# Patient Record
Sex: Female | Born: 1955 | Race: White | Hispanic: No | Marital: Married
Health system: Southern US, Community
[De-identification: ages and names within clinical notes are randomized; demographics above are authoritative.]

---

## 2004-09-17 ENCOUNTER — Emergency Department: Payer: Self-pay | Admitting: Unknown Physician Specialty

## 2010-10-26 ENCOUNTER — Emergency Department: Payer: Self-pay | Admitting: Unknown Physician Specialty

## 2011-06-12 ENCOUNTER — Emergency Department: Payer: Self-pay | Admitting: Emergency Medicine

## 2011-06-12 LAB — BASIC METABOLIC PANEL
BUN: 16 mg/dL (ref 7–18)
Calcium, Total: 9.1 mg/dL (ref 8.5–10.1)
Chloride: 110 mmol/L — ABNORMAL HIGH (ref 98–107)
Creatinine: 0.67 mg/dL (ref 0.60–1.30)
EGFR (African American): >60
Glucose: 94 mg/dL (ref 65–99)
Osmolality: 284 (ref 275–301)
Potassium: 3.7 mmol/L (ref 3.5–5.1)
Sodium: 142 mmol/L (ref 136–145)

## 2011-06-12 LAB — CBC
HGB: 13.9 g/dL (ref 12.0–16.0)
MCH: 31.2 pg (ref 26.0–34.0)
MCV: 91 fL (ref 80–100)
Platelet: 199 10*3/uL (ref 150–440)
RBC: 4.44 10*6/uL (ref 3.80–5.20)
RDW: 13.3 % (ref 11.5–14.5)
WBC: 8 10*3/uL (ref 3.6–11.0)

## 2011-06-12 LAB — CK TOTAL AND CKMB (NOT AT ARMC): CK-MB: 1.7 ng/mL (ref 0.5–3.6)

## 2011-06-20 ENCOUNTER — Emergency Department: Payer: Self-pay | Admitting: Internal Medicine

## 2011-06-20 LAB — BASIC METABOLIC PANEL
BUN: 11 mg/dL (ref 7–18)
Chloride: 110 mmol/L — ABNORMAL HIGH (ref 98–107)
Creatinine: 0.79 mg/dL (ref 0.60–1.30)
EGFR (African American): >60
Osmolality: 286 (ref 275–301)
Potassium: 3.5 mmol/L (ref 3.5–5.1)
Sodium: 143 mmol/L (ref 136–145)

## 2011-06-20 LAB — CBC
HCT: 40.6 % (ref 35.0–47.0)
HGB: 13.7 g/dL (ref 12.0–16.0)
MCH: 30.9 pg (ref 26.0–34.0)
MCV: 92 fL (ref 80–100)
Platelet: 217 10*3/uL (ref 150–440)
RDW: 13.4 % (ref 11.5–14.5)
WBC: 10.7 10*3/uL (ref 3.6–11.0)

## 2011-06-20 LAB — CK TOTAL AND CKMB (NOT AT ARMC)
CK, Total: 524 U/L — ABNORMAL HIGH (ref 21–215)
CK-MB: 4.5 ng/mL — ABNORMAL HIGH (ref 0.5–3.6)

## 2012-02-23 ENCOUNTER — Emergency Department: Payer: Self-pay | Admitting: Emergency Medicine

## 2012-02-23 LAB — CBC
HCT: 43.6 % (ref 35.0–47.0)
HGB: 15.3 g/dL (ref 12.0–16.0)
HGB: 15 g/dL (ref 12.0–16.0)
MCH: 32.7 pg (ref 26.0–34.0)
MCH: 31.9 pg (ref 26.0–34.0)
MCHC: 35.2 g/dL (ref 32.0–36.0)
MCHC: 34.4 g/dL (ref 32.0–36.0)
MCV: 93 fL (ref 80–100)
Platelet: 227 10*3/uL (ref 150–440)
RBC: 4.7 10*6/uL (ref 3.80–5.20)
WBC: 8.3 10*3/uL (ref 3.6–11.0)
WBC: 8 10*3/uL (ref 3.6–11.0)

## 2012-02-23 LAB — COMPREHENSIVE METABOLIC PANEL
BUN: 14 mg/dL (ref 7–18)
Bilirubin,Total: 0.4 mg/dL (ref 0.2–1.0)
EGFR (African American): >60
SGPT (ALT): 26 U/L (ref 12–78)
Total Protein: 7.5 g/dL (ref 6.4–8.2)

## 2012-02-23 LAB — TROPONIN I: Troponin-I: <0.02 ng/mL

## 2012-02-23 LAB — BASIC METABOLIC PANEL
BUN: 12 mg/dL (ref 7–18)
Co2: 18 mmol/L — ABNORMAL LOW (ref 21–32)
Creatinine: 0.9 mg/dL (ref 0.60–1.30)
EGFR (Non-African Amer.): >60

## 2012-05-11 ENCOUNTER — Emergency Department: Payer: Self-pay | Admitting: Emergency Medicine

## 2012-05-11 LAB — URINALYSIS, COMPLETE
Bilirubin,UR: NEGATIVE
Blood: NEGATIVE
Glucose,UR: NEGATIVE mg/dL (ref 0–75)
Ketone: NEGATIVE
Ph: 6 (ref 4.5–8.0)
Protein: NEGATIVE
RBC,UR: 3 /HPF (ref 0–5)
Specific Gravity: 1.015 (ref 1.003–1.030)
Squamous Epithelial: 18
WBC UR: 15 /HPF (ref 0–5)

## 2012-05-11 LAB — CBC
HCT: 42.7 % (ref 35.0–47.0)
HGB: 14.5 g/dL (ref 12.0–16.0)
MCH: 30.9 pg (ref 26.0–34.0)
MCHC: 33.8 g/dL (ref 32.0–36.0)
MCV: 91 fL (ref 80–100)
RBC: 4.69 10*6/uL (ref 3.80–5.20)
RDW: 13.4 % (ref 11.5–14.5)
WBC: 8.7 10*3/uL (ref 3.6–11.0)

## 2012-05-11 LAB — BASIC METABOLIC PANEL
Anion Gap: 5 — ABNORMAL LOW (ref 7–16)
BUN: 6 mg/dL — ABNORMAL LOW (ref 7–18)
Co2: 26 mmol/L (ref 21–32)
EGFR (African American): >60
EGFR (Non-African Amer.): >60

## 2012-06-07 ENCOUNTER — Emergency Department: Payer: Self-pay | Admitting: Internal Medicine

## 2012-06-07 LAB — URINALYSIS, COMPLETE
Bacteria: NONE SEEN
Bilirubin,UR: NEGATIVE
Blood: NEGATIVE
Glucose,UR: NEGATIVE mg/dL (ref 0–75)
Ketone: NEGATIVE
Leukocyte Esterase: NEGATIVE
Ph: 5 (ref 4.5–8.0)
Protein: NEGATIVE
RBC,UR: <1 /HPF (ref 0–5)
Squamous Epithelial: 3
WBC UR: 1 /HPF (ref 0–5)

## 2012-06-13 ENCOUNTER — Emergency Department: Payer: Self-pay | Admitting: Emergency Medicine

## 2012-06-15 ENCOUNTER — Emergency Department: Payer: Self-pay | Admitting: Emergency Medicine

## 2012-06-18 ENCOUNTER — Emergency Department: Payer: Self-pay | Admitting: Emergency Medicine

## 2012-06-18 LAB — CBC
HCT: 41.2 % (ref 35.0–47.0)
MCH: 31 pg (ref 26.0–34.0)
MCHC: 34.4 g/dL (ref 32.0–36.0)
MCV: 90 fL (ref 80–100)
Platelet: 215 10*3/uL (ref 150–440)
RBC: 4.57 10*6/uL (ref 3.80–5.20)

## 2012-06-18 LAB — BASIC METABOLIC PANEL
Anion Gap: 4 — ABNORMAL LOW (ref 7–16)
BUN: 13 mg/dL (ref 7–18)
Calcium, Total: 8.7 mg/dL (ref 8.5–10.1)
Co2: 27 mmol/L (ref 21–32)
Creatinine: 0.61 mg/dL (ref 0.60–1.30)
EGFR (African American): >60
EGFR (Non-African Amer.): >60
Glucose: 101 mg/dL — ABNORMAL HIGH (ref 65–99)
Sodium: 142 mmol/L (ref 136–145)

## 2012-06-18 LAB — PRO B NATRIURETIC PEPTIDE: B-Type Natriuretic Peptide: 141 pg/mL — ABNORMAL HIGH (ref 0–125)

## 2012-06-18 LAB — TROPONIN I: Troponin-I: <0.02 ng/mL

## 2012-06-26 ENCOUNTER — Emergency Department: Payer: Self-pay | Admitting: Emergency Medicine

## 2012-06-26 LAB — CBC
HCT: 42.7 % (ref 35.0–47.0)
HGB: 14.6 g/dL (ref 12.0–16.0)
MCH: 31 pg (ref 26.0–34.0)
MCHC: 34.2 g/dL (ref 32.0–36.0)
MCV: 91 fL (ref 80–100)
Platelet: 245 10*3/uL (ref 150–440)

## 2012-06-26 LAB — BASIC METABOLIC PANEL
Anion Gap: 6 — ABNORMAL LOW (ref 7–16)
BUN: 9 mg/dL (ref 7–18)
Calcium, Total: 9.1 mg/dL (ref 8.5–10.1)
EGFR (African American): >60
Osmolality: 273 (ref 275–301)

## 2012-07-09 ENCOUNTER — Inpatient Hospital Stay: Payer: Self-pay | Admitting: Internal Medicine

## 2012-07-09 LAB — CBC
HCT: 43.7 % (ref 35.0–47.0)
HGB: 14.6 g/dL (ref 12.0–16.0)
MCV: 92 fL (ref 80–100)
Platelet: 221 10*3/uL (ref 150–440)
RBC: 4.76 10*6/uL (ref 3.80–5.20)
RDW: 13.3 % (ref 11.5–14.5)

## 2012-07-09 LAB — BASIC METABOLIC PANEL
Anion Gap: 8 (ref 7–16)
BUN: 12 mg/dL (ref 7–18)
Calcium, Total: 9 mg/dL (ref 8.5–10.1)
Chloride: 111 mmol/L — ABNORMAL HIGH (ref 98–107)
Co2: 23 mmol/L (ref 21–32)
Creatinine: 0.51 mg/dL — ABNORMAL LOW (ref 0.60–1.30)
EGFR (African American): >60
EGFR (Non-African Amer.): >60
Glucose: 103 mg/dL — ABNORMAL HIGH (ref 65–99)
Osmolality: 283 (ref 275–301)

## 2012-07-09 LAB — CK TOTAL AND CKMB (NOT AT ARMC): CK-MB: 0.6 ng/mL (ref 0.5–3.6)

## 2012-07-09 LAB — TROPONIN I: Troponin-I: <0.02 ng/mL

## 2012-07-10 LAB — CBC WITH DIFFERENTIAL/PLATELET
Basophil #: 0 10*3/uL (ref 0.0–0.1)
Eosinophil #: 0.1 10*3/uL (ref 0.0–0.7)
HCT: 41.4 % (ref 35.0–47.0)
HGB: 14.2 g/dL (ref 12.0–16.0)
Lymphocyte #: 0.5 10*3/uL — ABNORMAL LOW (ref 1.0–3.6)
Lymphocyte %: 5 %
MCH: 31 pg (ref 26.0–34.0)
MCHC: 34.4 g/dL (ref 32.0–36.0)
MCV: 90 fL (ref 80–100)
Monocyte #: 0.1 x10 3/mm — ABNORMAL LOW (ref 0.2–0.9)
Monocyte %: 1.4 %
Neutrophil %: 92.3 %
Platelet: 203 10*3/uL (ref 150–440)
RBC: 4.59 10*6/uL (ref 3.80–5.20)
RDW: 13.4 % (ref 11.5–14.5)
WBC: 9.5 10*3/uL (ref 3.6–11.0)

## 2012-07-10 LAB — BASIC METABOLIC PANEL
Chloride: 109 mmol/L — ABNORMAL HIGH (ref 98–107)
Co2: 26 mmol/L (ref 21–32)
Creatinine: 0.67 mg/dL (ref 0.60–1.30)
Osmolality: 283 (ref 275–301)
Sodium: 141 mmol/L (ref 136–145)

## 2012-08-22 ENCOUNTER — Emergency Department: Payer: Self-pay | Admitting: Emergency Medicine

## 2012-08-22 LAB — COMPREHENSIVE METABOLIC PANEL
Albumin: 3.4 g/dL (ref 3.4–5.0)
Alkaline Phosphatase: 73 U/L (ref 50–136)
Anion Gap: 9 (ref 7–16)
Bilirubin,Total: 0.5 mg/dL (ref 0.2–1.0)
Calcium, Total: 9.5 mg/dL (ref 8.5–10.1)
Chloride: 106 mmol/L (ref 98–107)
Co2: 25 mmol/L (ref 21–32)
Creatinine: 0.77 mg/dL (ref 0.60–1.30)
EGFR (African American): >60
EGFR (Non-African Amer.): >60
Glucose: 92 mg/dL (ref 65–99)
Osmolality: 277 (ref 275–301)
Potassium: 3.5 mmol/L (ref 3.5–5.1)
SGPT (ALT): 28 U/L (ref 12–78)
Total Protein: 7.5 g/dL (ref 6.4–8.2)

## 2012-08-22 LAB — CBC
HCT: 42.3 % (ref 35.0–47.0)
MCHC: 34.2 g/dL (ref 32.0–36.0)
MCV: 90 fL (ref 80–100)
RDW: 13.5 % (ref 11.5–14.5)
WBC: 9.5 10*3/uL (ref 3.6–11.0)

## 2012-08-22 LAB — CK TOTAL AND CKMB (NOT AT ARMC): CK, Total: 62 U/L (ref 21–215)

## 2012-08-25 ENCOUNTER — Emergency Department: Payer: Self-pay | Admitting: Emergency Medicine

## 2012-08-25 LAB — CBC WITH DIFFERENTIAL/PLATELET
Basophil #: 0.2 10*3/uL — ABNORMAL HIGH (ref 0.0–0.1)
Basophil %: 0.9 %
HGB: 14.7 g/dL (ref 12.0–16.0)
Lymphocyte #: 7.1 10*3/uL — ABNORMAL HIGH (ref 1.0–3.6)
Lymphocyte %: 40.3 %
MCHC: 32 g/dL (ref 32.0–36.0)
MCV: 95 fL (ref 80–100)
Monocyte %: 7.2 %
Neutrophil #: 7 10*3/uL — ABNORMAL HIGH (ref 1.4–6.5)
RDW: 13.7 % (ref 11.5–14.5)
WBC: 17.7 10*3/uL — ABNORMAL HIGH (ref 3.6–11.0)

## 2012-08-25 LAB — COMPREHENSIVE METABOLIC PANEL
Albumin: 3.3 g/dL — ABNORMAL LOW (ref 3.4–5.0)
Alkaline Phosphatase: 87 U/L (ref 50–136)
Anion Gap: 6 — ABNORMAL LOW (ref 7–16)
Bilirubin,Total: 0.3 mg/dL (ref 0.2–1.0)
Calcium, Total: 9 mg/dL (ref 8.5–10.1)
Chloride: 104 mmol/L (ref 98–107)
Creatinine: 1.09 mg/dL (ref 0.60–1.30)
EGFR (African American): >60
Glucose: 197 mg/dL — ABNORMAL HIGH (ref 65–99)
Osmolality: 277 (ref 275–301)
SGOT(AST): 45 U/L — ABNORMAL HIGH (ref 15–37)
Sodium: 137 mmol/L (ref 136–145)

## 2012-09-08 DEATH — deceased

## 2014-05-31 NOTE — H&P (Signed)
PATIENT NAME:  Crystal Mccall, Crystal Mccall MR#:  [id number] DATE OF BIRTH:  [birth year]  DATE OF ADMISSION:  07/09/2012  REFERRING PHYSICIAN: Dr. Mindi Junker   FAMILY PHYSICIAN: None.   REASON FOR ADMISSION:  Acute respiratory failure.   HISTORY OF PRESENT ILLNESS: The patient is a 59 year old female with a history of COPD and asthma, who quit smoking 2 weeks ago. Was in the Emergency Room recently for COPD exacerbation, and was sent out on Advair with albuterol SVNs. Re-presents now with worsening shortness of breath and cough. In the Emergency Room, the patient was profoundly tachypneic and hypoxic, requiring BiPAP. Her blood gas was marginal on BiPAP. She is now admitted to the Intensive Care Unit for further treatment and evaluation.   PAST MEDICAL HISTORY: 1.  COPD/tobacco abuse.  2.  History of asthma.  3.  Migraine headaches.  4.  Depression. 5.  Previous right shoulder injury.  6.  Status post cholecystectomy.  7.  Status post hysterectomy.   MEDICATIONS: 1.  Albuterol 2.5 mg via SVN q. 4 hours p.r.n. shortness of breath.  2.  ProAir 2 puffs q. 4 hours p.r.n. shortness of breath.  3.  Prednisone taper as directed.  4.  Advair 100/50, 1 puff b.i.d.   ALLERGIES:  No known drug allergies.   SOCIAL HISTORY:  The patient quit smoking 2 weeks ago. Denies alcohol abuse.   FAMILY HISTORY:  Positive for hypertension, but otherwise unremarkable.    REVIEW OF SYSTEMS: CONSTITUTIONAL:  No fever or change in weight.  EYES:  No blurred or double vision. No glaucoma.  EARS, NOSE, THROAT: No tinnitus or hearing loss. No nasal discharge or bleeding. No difficulty swallowing.  RESPIRATORY:  No hemoptysis. No painful respiration.  CARDIOVASCULAR:  No chest pain or orthopnea. No palpitations or syncope.  GASTROINTESTINAL: No nausea, vomiting or diarrhea. No abdominal pain. No change in bowel habits.  GENITOURINARY:  No dysuria or hematuria. No incontinence.  ENDOCRINE:  No polyuria or polydipsia. No  heat or cold intolerance.  HEMATOLOGIC:  The patient denies anemia, easy bruising or bleeding.  LYMPHATIC:  No swollen glands.  MUSCULOSKELETAL: The patient denies pain in her neck, back, shoulders, knees, hips. No gout.  NEUROLOGIC:  No numbness. Denies recent migraines. No stroke or seizures.  PSYCHIATRIC:  The patient admits to anxiety, but denies insomnia or depression.   PHYSICAL EXAMINATION: GENERAL:  The patient is acutely ill-appearing, in moderate distress.  VITAL SIGNS:  Remarkable for a blood pressure of 170/90, with a heart rate of 82, and a respiratory rate of 22. She is afebrile.  HEENT:  Normocephalic, atraumatic. The pupils are equally round and reactive to light and accommodation. Extraocular movements are intact. Sclerae not icteric. Conjunctivae are clear.  Oropharynx is clear.  NECK:  Supple, without JVD or bruits. No adenopathy or thyromegaly was noted.  LUNGS:  Revealed decreased breath sounds, with diffuse wheezes and rhonchi. No dullness. Respiratory effort is increased.  CARDIAC EXAM:  Regular rate and rhythm, with a normal S1 and S2. No significant rubs, murmurs or gallops. PMI is nondisplaced. Chest wall is nontender.  ABDOMEN: Soft, nontender, with normoactive bowel sounds. No organomegaly or masses were appreciated. No hernias or bruits were noted.  EXTREMITIES: Without clubbing, cyanosis, edema. Pulses were 2+ bilaterally.  SKIN:  Warm and dry, without rash or lesions.  NEUROLOGIC EXAM:  Revealed cranial nerves II through XII grossly intact. Deep tendon reflexes were symmetric. Motor and sensory exams nonfocal.  PSYCHIATRIC EXAM:  Revealed the patient was  alert and oriented to person, place and time. She was anxious but cooperative, and used good judgment.   LABORATORY DATA:  Troponin was less than 0.02. White count was 7.9, with a hemoglobin of 14.6. ABG on BiPAP revealed a pO2 of 71, with a pCO2 of 41, and a pH of 7.41, with a sat of 97%. Chest x-ray was  unremarkable.   ASSESSMENT: 1.  Acute respiratory failure.  2.  Chronic obstructive pulmonary disease exacerbation.  3.  Presumed bronchitis.  4.  Anxiety.  5.  History of migraine headaches.   PLAN: The patient will be admitted to the Intensive Care Unit as a FULL CODE on BiPAP. Will begin IV steroids, IV antibiotics, with DuoNeb SVNs. Will add Spiriva and continue Advair. Will send off sputum for Gram stain and culture. Will follow her sugars while on steroids. Will give empiric IV magnesium at this time. Follow up chest x-ray in the morning. Wean oxygen as tolerated. Pulmonary consult in the morning. Clear liquid diet for now. Vital signs q. 4 hours. Further treatment and evaluation will depend upon the patient's progress.   Total time spent on this patient was 50 minutes.    ____________________________ Duane LopeJeffrey D. Judithann SheenSparks, MD jds:mr D: 07/09/2012 20:30:40 ET T: 07/09/2012 21:06:12 ET JOB#: [id number]  cc: Duane LopeJeffrey D. Judithann SheenSparks, MD, <Dictator> [place] Rodena Medin Aasiya Creasey MD ELECTRONICALLY SIGNED 07/09/2012 21:23

## 2014-05-31 NOTE — Discharge Summary (Signed)
PATIENT NAME:  Crystal Mccall, Crystal Mccall MR#:  [id number] DATE OF BIRTH:  Aug 12, 1955  DATE OF ADMISSION:  07/09/2012 DATE OF DISCHARGE:  07/12/2012  PRIMARY CARE PHYSICIAN:  Right now is to be determined.  I did give the patient names and numbers of some physicians in the area.  DISCHARGE DIAGNOSES:  1.  Came in with acute respiratory failure, which had improved.  2.  Chronic obstructive pulmonary disease exacerbation.  3.  Tobacco abuse.  4.  Shoulder and neck pain.  5.  Steroid-induced hyperglycemia.   MEDICATIONS ON DISCHARGE:  Include prednisone 5 mg 6 tablets day 1, 5 tablets day 2, 4 tablets day 3, 3 tablets day 4 and 5, 2 tablets day 6 and 7, 1 tablet day 8 and 9.  Tramadol 50 mg every 6 hours as needed for pain, lorazepam 0.5 mg once a day as needed for muscle spasm.  DuoNeb nebulizer solution 3 mL every 6 hours as needed for shortness of breath, ProAir HFA CFC 1 puff 4 times a day as needed, Advair Diskus 250/50 1 inhalation twice a day, Spiriva 1 capsule inhalation once a day, Zithromax 250 mg once a day for 2 days.  Nicotine patch 21 mg per chest wall one patch daily.   DIET:  Regular diet.   ACTIVITY:  As tolerated.   FOLLOW-UP:  With medical doctor in the area 1 to 2 weeks.   HOSPITAL COURSE:  The patient was admitted 07/09/2012 with acute respiratory failure, history of chronic obstructive pulmonary disease, asthma, quit smoking two weeks prior, came in a few times to the ER, this time came back tachypneic, hypoxic, requiring BiPAP in order to oxygenate and was admitted to the ICU for impending respiratory failure and acute respiratory failure.  The patient was started on IV steroids, IV antibiotics and nebulizer treatments.   LABORATORY AND RADIOLOGICAL DATA:  Included white blood cell count 7.9, hemoglobin and hematocrit 14.6 and 43.7, platelet count of 221.  Chest x-ray showed no acute cardiopulmonary disease.  Glucose 103, BUN 12, creatinine 0.51, sodium 142, potassium 4.0, chloride  111, CO2 23, calcium 9.0.  Troponin negative.  EKG, normal sinus rhythm.  ABG showed a pH of 7.41, pCO2 41, pO2 71, O2 saturation 97.4 on BiPAP.  Repeat chest x-ray showed no acute cardiopulmonary disease.  Sugars were borderline with the steroids, high as 238, but most in the 140s.   HOSPITAL COURSE PER PROBLEM LIST:  1.  For the patient's acute respiratory failure, initially she was on BiPAP in order to oxygenate and move air, high risk for impending intubation, but patient did improve and upon discharge pulse ox was 93% after ambulation and patient was off oxygen at that time.  2.  For the patient's chronic obstructive pulmonary disease exacerbation, high dose Solu-Medrol was given.  The patient was discharged home on a prednisone taper, nebulizers, rescue inhaler, Advair and Spiriva and finish up a course of antibiotic.  3.  For her tobacco abuse, smoking cessation counseling done three minutes during the hospitalization.  The patient will not go back to the smoking.  4.  For her shoulder and neck pain and headaches, she says Ativan given here helped and so I will prescribe a small amount of that.  5.  For her steroid-induced hyperglycemia, sugars were elevated, can recheck this as outpatient, but I think this is all steroid-induced.    Time spent on discharge 35 minutes.    ____________________________ Herschell Dimesichard J. Renae GlossWieting, MD rjw:ea D: 07/12/2012 16:36:54 ET  T: 07/12/2012 20:07:52 ET JOB#: [id number]  cc: Herschell Dimes. Renae Gloss, MD, <Dictator> Salley Scarlet MD ELECTRONICALLY SIGNED 07/15/2012 15:10

## 2014-08-06 IMAGING — CR DG CHEST 2V
1 series · 2 of 2 positions shown · non-contrast
Comparison: none

REASON FOR EXAM: SOB
COMMENTS:

PROCEDURE:     DXR - DXR CHEST PA (OR AP) AND LATERAL  - June 18, 2012  [DATE]
RESULT:     The lungs are clear. The cardiac silhouette and visualized bony
skeleton are unremarkable.

[Series 1: w chest pa · 0.14mm/px · 2 of 2 slices shown]
[im 1/2]
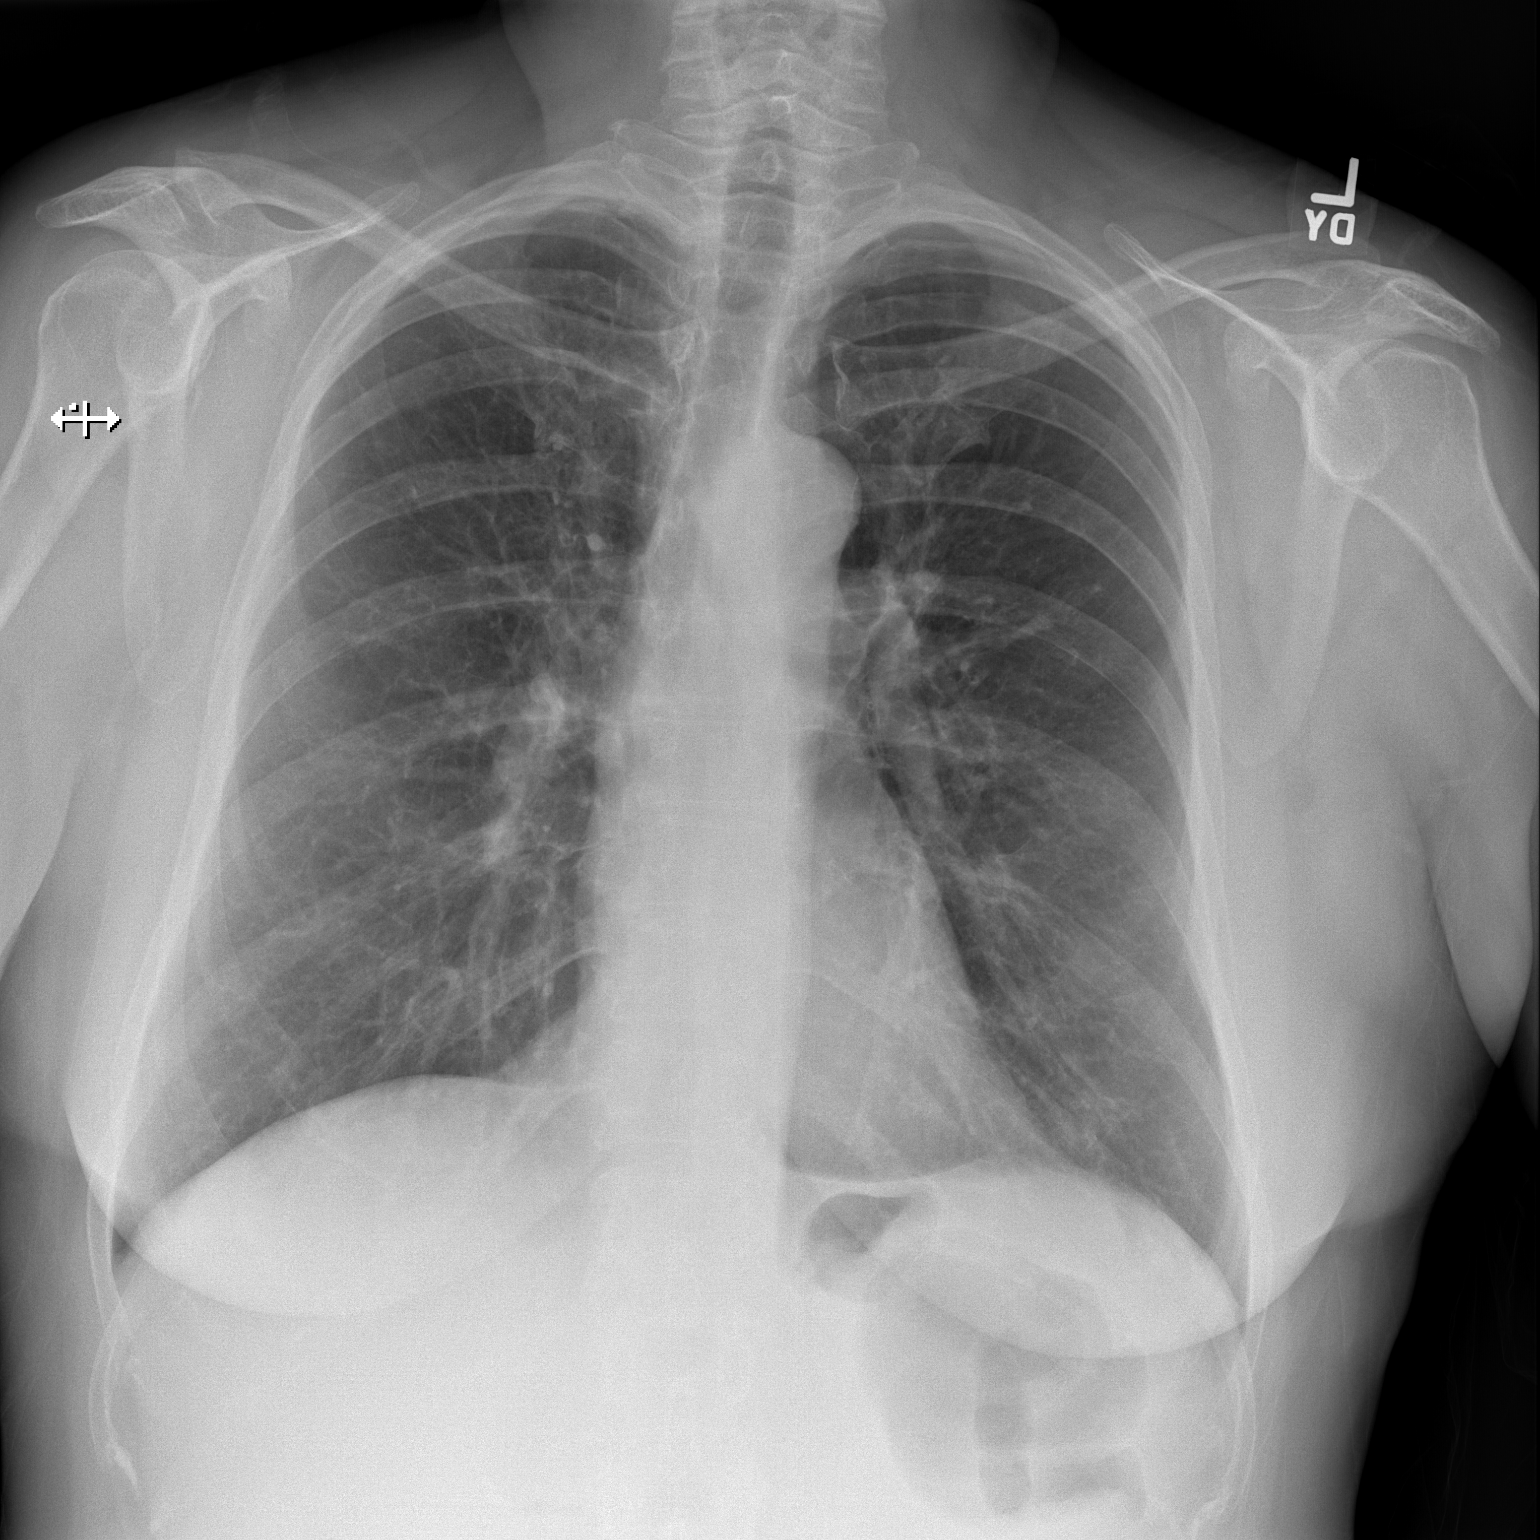
[im 2/2]
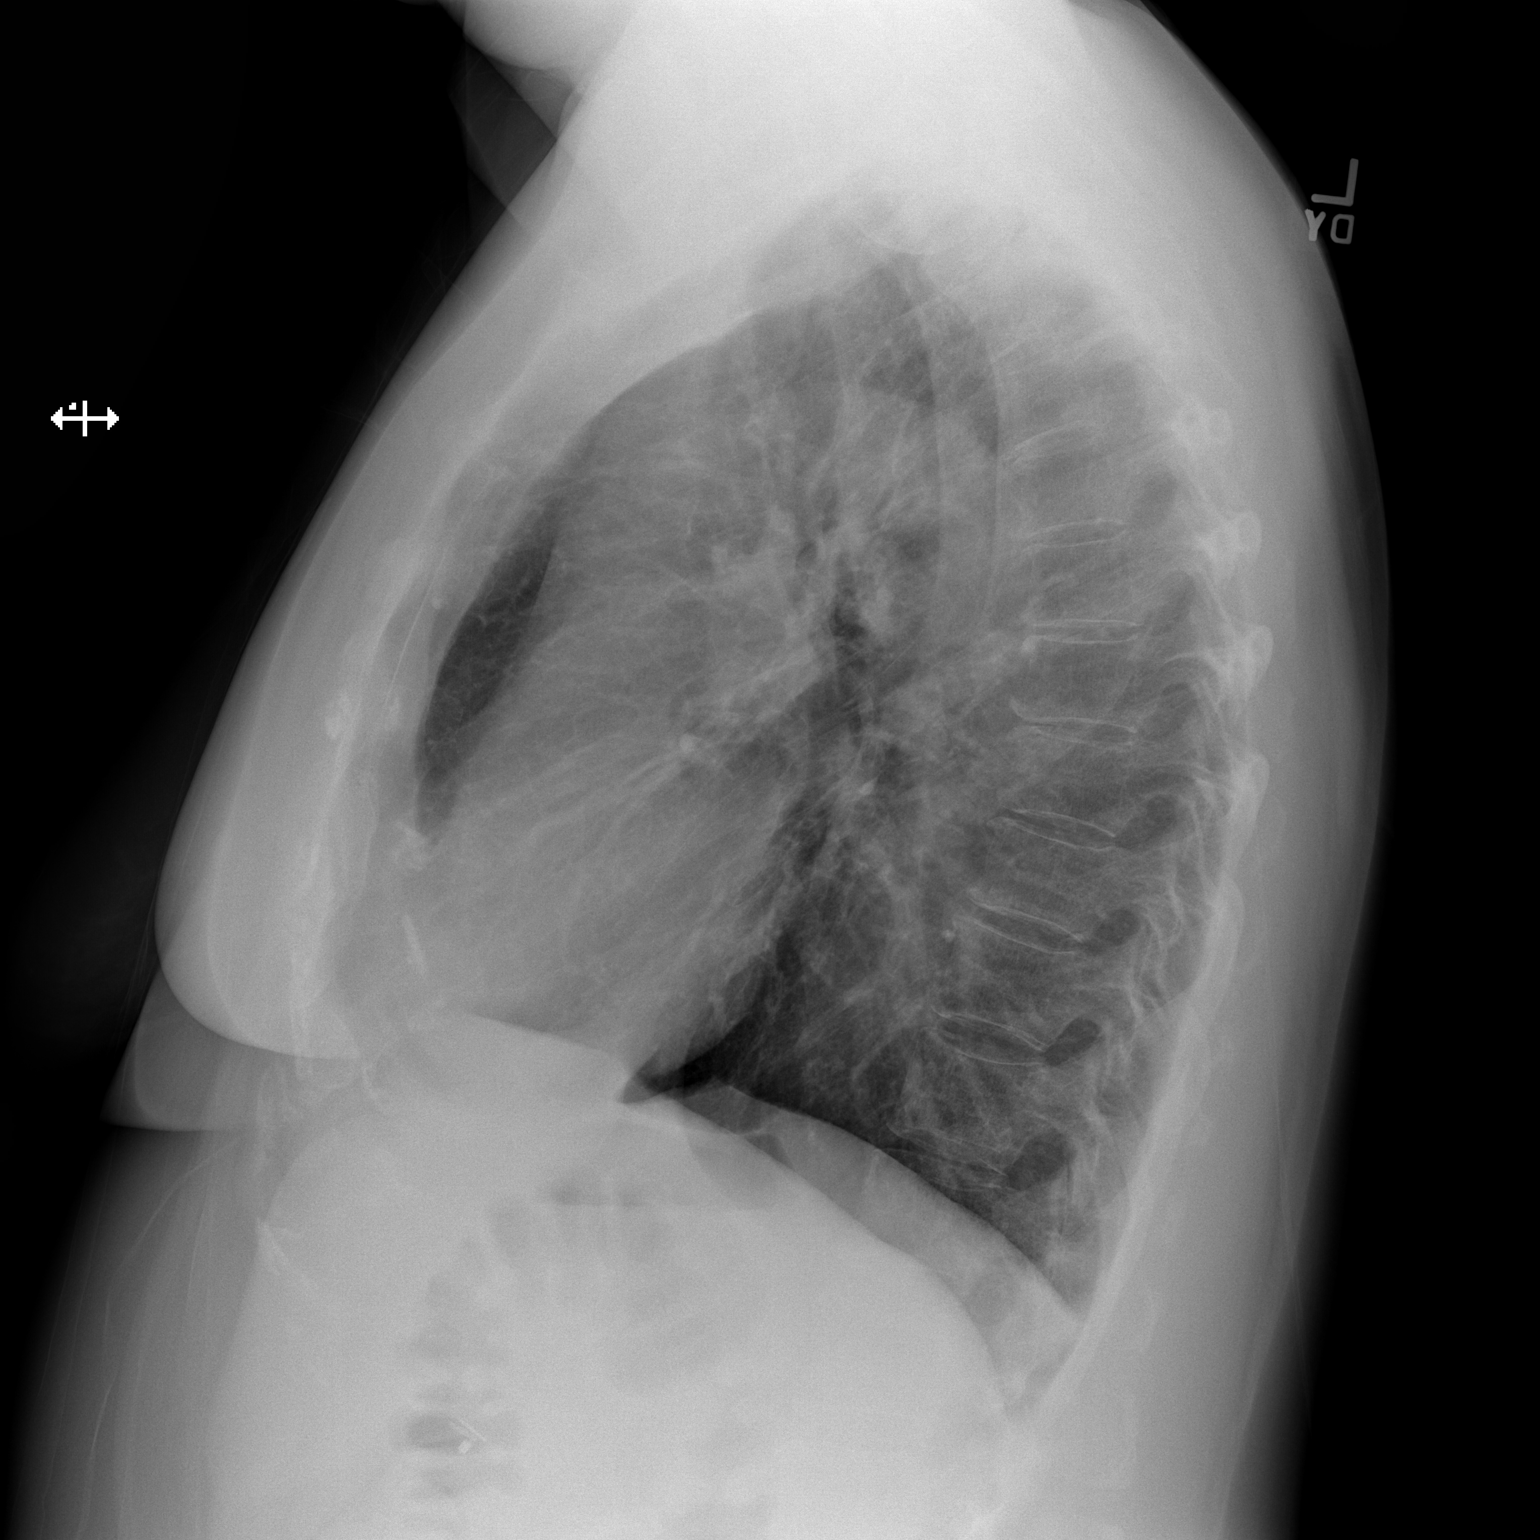

[2 of 2 positions shown; findings below may reference images not displayed]

IMPRESSION: 1. Chest radiograph without evidence of acute cardiopulmonary disease.
2. Comparison made to prior study any 02/23/2012.

## 2014-08-14 IMAGING — CR DG CHEST 1V PORT
1 series · 1 of 1 positions shown · non-contrast
Comparison: none

REASON FOR EXAM: dyspnea, COPD
COMMENTS:

PROCEDURE:     DXR - DXR PORTABLE CHEST SINGLE VIEW  - June 26, 2012  [DATE]
RESULT:     Comparison: 06/18/2012

[ap]
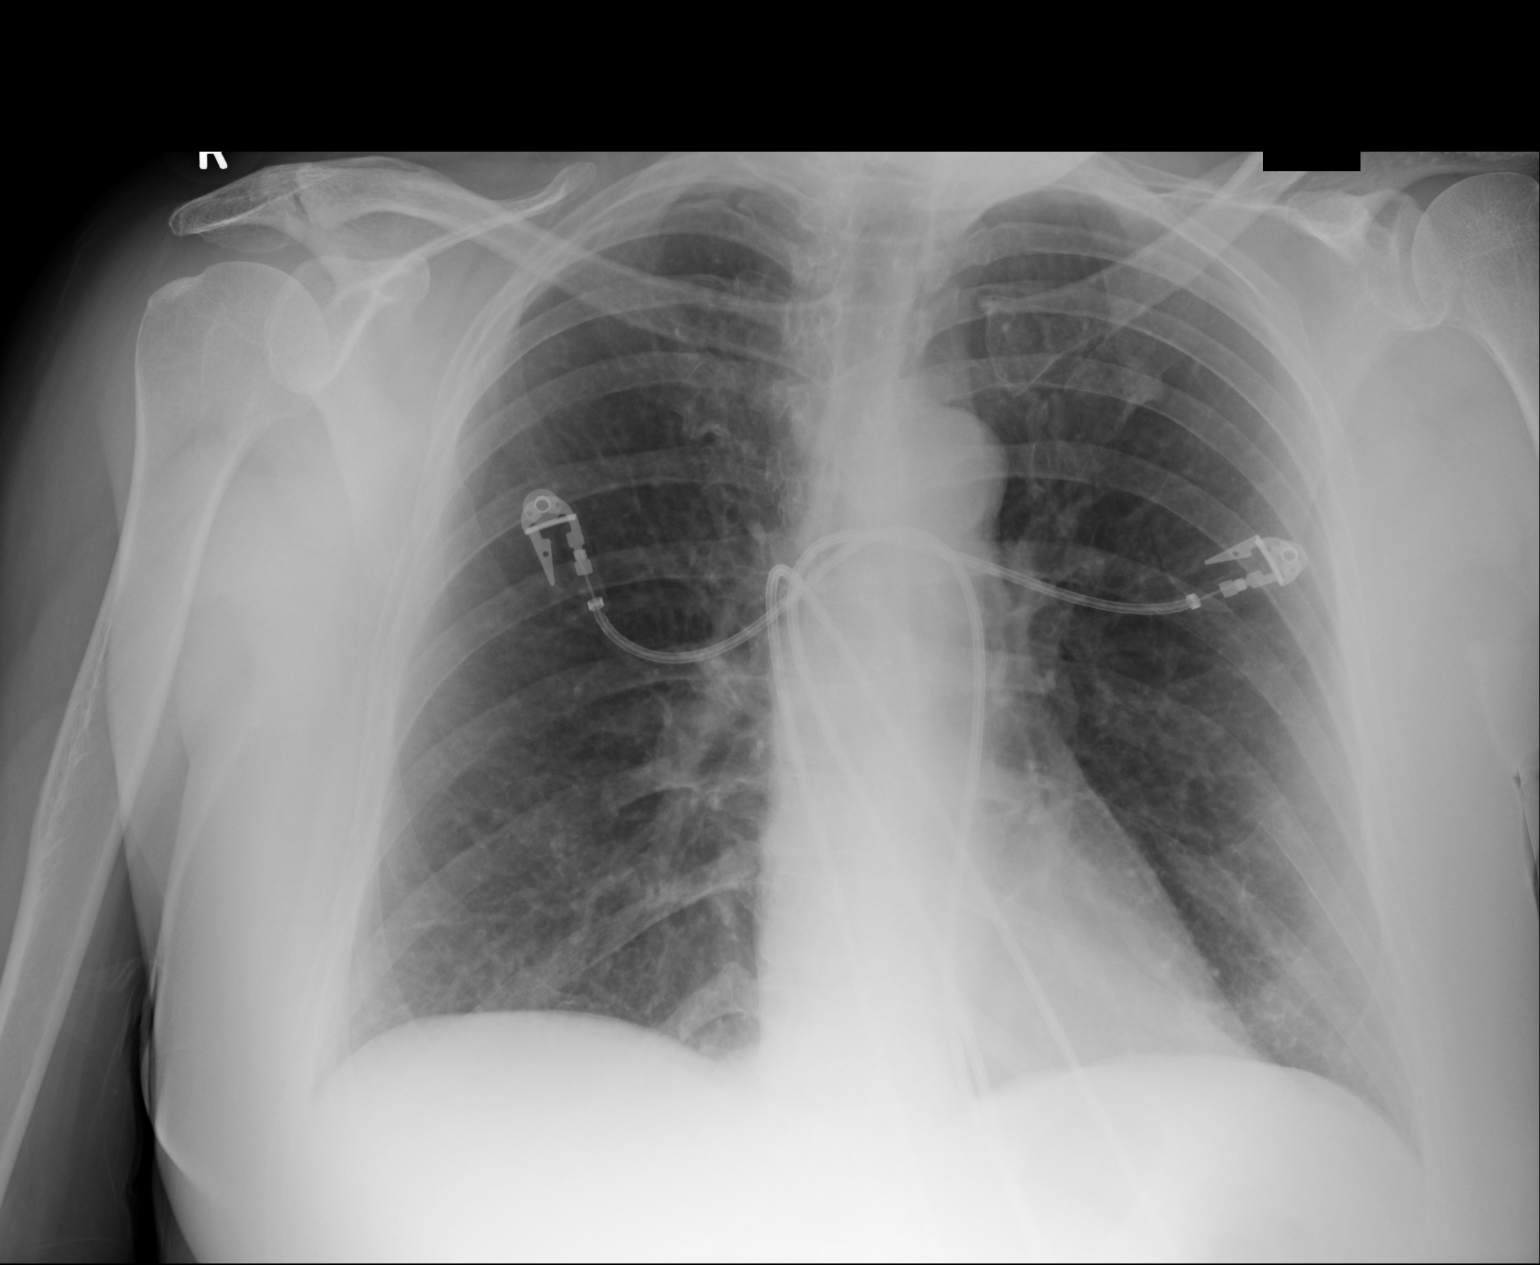

[1 of 1 positions shown; findings below may reference images not displayed]

FINDINGS: The heart and mediastinum are stable. Small nodular density overlying left
upper lung is felt to be artifactual secondary to overlying first
costochondral junction. No focal pulmonary opacities.
IMPRESSION: No acute cardiopulmonary disease.

[REDACTED]
